# Patient Record
Sex: Male | Born: 1995
Health system: Southern US, Community
[De-identification: ages and names within clinical notes are randomized; demographics above are authoritative.]

## PROBLEM LIST (undated history)

## (undated) DIAGNOSIS — S060XAA Concussion with loss of consciousness status unknown, initial encounter: Secondary | ICD-10-CM

## (undated) DIAGNOSIS — S060X9A Concussion with loss of consciousness of unspecified duration, initial encounter: Secondary | ICD-10-CM

## (undated) DIAGNOSIS — J45909 Unspecified asthma, uncomplicated: Secondary | ICD-10-CM

## (undated) HISTORY — PX: WISDOM TOOTH EXTRACTION: SHX21

---
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
## 2015-12-01 ENCOUNTER — Encounter (HOSPITAL_BASED_OUTPATIENT_CLINIC_OR_DEPARTMENT_OTHER): Payer: Self-pay

## 2015-12-01 ENCOUNTER — Emergency Department (HOSPITAL_BASED_OUTPATIENT_CLINIC_OR_DEPARTMENT_OTHER)
Admission: EM | Admit: 2015-12-01 | Discharge: 2015-12-02 | Disposition: A | Discharge status: Home / Self Care | Payer: 59 | Attending: Emergency Medicine | Admitting: Emergency Medicine

## 2015-12-01 DIAGNOSIS — J45909 Unspecified asthma, uncomplicated: Secondary | ICD-10-CM | POA: Insufficient documentation

## 2015-12-01 DIAGNOSIS — B349 Viral infection, unspecified: Secondary | ICD-10-CM | POA: Insufficient documentation

## 2015-12-01 DIAGNOSIS — R197 Diarrhea, unspecified: Secondary | ICD-10-CM

## 2015-12-01 DIAGNOSIS — H9209 Otalgia, unspecified ear: Secondary | ICD-10-CM | POA: Insufficient documentation

## 2015-12-01 DIAGNOSIS — R509 Fever, unspecified: Secondary | ICD-10-CM | POA: Diagnosis present

## 2015-12-01 MED ORDER — ACETAMINOPHEN 325 MG PO TABS
650.0000 mg | ORAL_TABLET | Freq: Once | ORAL | Status: AC
  Administered 2015-12-01: 650 mg via ORAL
  Filled 2015-12-01: qty 2

## 2015-12-01 MED ORDER — IBUPROFEN 400 MG PO TABS
400.0000 mg | ORAL_TABLET | Freq: Once | ORAL | Status: AC
  Administered 2015-12-02: 400 mg via ORAL
  Filled 2015-12-01: qty 1

## 2015-12-01 MED ORDER — ONDANSETRON 8 MG PO TBDP
8.0000 mg | ORAL_TABLET | Freq: Once | ORAL | Status: AC
  Administered 2015-12-02: 8 mg via ORAL
  Filled 2015-12-01: qty 1

## 2015-12-01 MED ORDER — DICYCLOMINE HCL 10 MG/ML IM SOLN
20.0000 mg | Freq: Once | INTRAMUSCULAR | Status: AC
  Administered 2015-12-02: 20 mg via INTRAMUSCULAR
  Filled 2015-12-01: qty 2

## 2015-12-01 NOTE — ED Notes (Addendum)
C/o fever x 48 hours-diarrhea x 3 today-ibuprofen 200mg  at 930pm-NAD-steady gait

## 2015-12-01 NOTE — ED Provider Notes (Signed)
CSN: [id number]     Arrival date & time 12/01/15  2325 History  By signing my name below, I, Alyssa GroveMartin Green, attest that this documentation has been prepared under the direction and in the presence of Shirell Struthers, MD. Electronically Signed: Alyssa GroveMartin Green, ED Scribe. 12/01/2015. 11:48 PM.   Chief Complaint  Patient presents with  . Fever    Patient is a 20 y.o. male presenting with diarrhea. The history is provided by the patient. No language interpreter was used.  Diarrhea Quality:  Watery Severity:  Mild Onset quality:  Sudden Number of episodes:  3 Timing:  Rare Progression:  Unchanged Relieved by:  Nothing Worsened by:  Nothing tried Ineffective treatments:  None tried Associated symptoms: fever, headaches and URI   Associated symptoms: no abdominal pain and no vomiting   Risk factors: no sick contacts, no suspicious food intake and no travel to endemic areas     HPI Comments: William Mckee is a 20 y.o. male who presents to the Emergency Department complaining of fever and diarrhead onset 2 days ago. Pt reports fever of 48 hours and diarrhea of 18 hours. Pt reports associated headaches, sore throat, and nausea. Pt used ibuprofen with relief to symptoms, but symptoms returned. Pt has also used Excedrin for his headache. Pt denies foreign travel, camping, or sick contact. Pt denies vomiting and rash. Pt denies FHx of Chron's disease or Ulcerative Colitis.  Past Medical History  Diagnosis Date  . Asthma   . Concussion    Past Surgical History  Procedure Laterality Date  . Wisdom tooth extraction     No family history on file. Social History  Substance Use Topics  . Smoking status: Never Smoker   . Smokeless tobacco: None  . Alcohol Use: Yes     Comment: occ    Review of Systems  Constitutional: Positive for fever.  HENT: Positive for congestion, ear pain and sore throat. Negative for trouble swallowing.   Cardiovascular: Negative for chest pain.  Gastrointestinal:  Positive for diarrhea. Negative for vomiting and abdominal pain.  Neurological: Positive for headaches.  All other systems reviewed and are negative.   Allergies  Review of patient's allergies indicates no known allergies.  Home Medications   Prior to Admission medications   Not on File   BP 143/88 mmHg  Pulse 109  Temp(Src) 103.1 F (39.5 C) (Oral)  Resp 18  Ht 6\' 1"  (1.854 m)  Wt 210 lb (95.255 kg)  BMI 27.71 kg/m2  SpO2 96% Physical Exam  Constitutional: He is oriented to person, place, and time. He appears well-developed and well-nourished. No distress.  HENT:  Head: Normocephalic and atraumatic.  Right Ear: Tympanic membrane normal.  Left Ear: Tympanic membrane normal.  Mouth/Throat: Oropharynx is clear and moist. No oropharyngeal exudate.  Eyes: EOM are normal. Pupils are equal, round, and reactive to light.  Neck: Normal range of motion. Neck supple. No tracheal deviation present.  Cardiovascular: Normal rate, regular rhythm, normal heart sounds and intact distal pulses.   Pulmonary/Chest: Effort normal and breath sounds normal. No respiratory distress. He has no wheezes. He has no rales.  Abdominal: Soft. Bowel sounds are increased. There is no tenderness. There is no rigidity, no rebound, no guarding, no tenderness at McBurney's point and negative Murphy's sign.  Hyperactive bowel sounds  Musculoskeletal: Normal range of motion.  Lymphadenopathy:    He has no cervical adenopathy.  Neurological: He is alert and oriented to person, place, and time. He has normal reflexes. He  displays normal reflexes.  Skin: Skin is warm and dry.  Psychiatric: He has a normal mood and affect.  Nursing note and vitals reviewed.   ED Course  Procedures (including critical care time)  DIAGNOSTIC STUDIES: Oxygen Saturation is 96% on RA, adequate by my interpretation.    COORDINATION OF CARE: 11:55 PM Discussed treatment plan with pt at bedside which includes Rapid Strep Test and  pt agreed to plan.  Labs Review Labs Reviewed - No data to display  Imaging Review No results found. I have personally reviewed and evaluated these images and lab results as part of my medical decision-making.   EKG Interpretation None      MDM   Final diagnoses:  None    Filed Vitals:   12/01/15 2330 12/02/15 0134  BP: 143/88 119/58  Pulse: 109 74  Temp: 103.1 F (39.5 C) 98.6 F (37 C)  Resp: 18 16   Results for orders placed or performed during the hospital encounter of 12/01/15  Rapid strep screen  Result Value Ref Range   Streptococcus, Group A Screen (Direct) NEGATIVE NEGATIVE   No results found.  Medications  acetaminophen (TYLENOL) tablet 650 mg (650 mg Oral Given 12/01/15 2336)  ondansetron (ZOFRAN-ODT) disintegrating tablet 8 mg (8 mg Oral Given 12/02/15 0006)  dicyclomine (BENTYL) injection 20 mg (20 mg Intramuscular Given 12/02/15 0007)  ibuprofen (ADVIL,MOTRIN) tablet 400 mg (400 mg Oral Given 12/02/15 0006)    Given constellation of symptoms,  This is a viral illness.  No diarrhea in the ED PO challenged.  Bland diet x 48 hours.  Follow up with your PMD.  Strict return precautions given  I personally performed the services described in this documentation, which was scribed in my presence. The recorded information has been reviewed and is accurate.      Cy BlamerApril Panayiota Larkin, MD 12/02/15 [phone]

## 2015-12-01 NOTE — ED Notes (Signed)
MD at bedside. 

## 2015-12-02 ENCOUNTER — Encounter (HOSPITAL_BASED_OUTPATIENT_CLINIC_OR_DEPARTMENT_OTHER): Payer: Self-pay | Admitting: Emergency Medicine

## 2015-12-02 LAB — RAPID STREP SCREEN (MED CTR MEBANE ONLY): STREPTOCOCCUS, GROUP A SCREEN (DIRECT): NEGATIVE

## 2015-12-02 MED ORDER — DICYCLOMINE HCL 20 MG PO TABS: 20.0000 mg | ORAL_TABLET | Freq: Two times a day (BID) | ORAL | Status: AC

## 2015-12-02 MED ORDER — IBUPROFEN 800 MG PO TABS: 800.0000 mg | ORAL_TABLET | Freq: Three times a day (TID) | ORAL | Status: AC

## 2015-12-02 MED FILL — DICYCLOMINE 20 MG TABLET: 20 | 10 days supply | Qty: 20 | Fill #0

## 2015-12-02 MED FILL — IBUPROFEN 800 MG TABLET: 800 | 7 days supply | Qty: 21 | Fill #0

## 2015-12-04 LAB — CULTURE, GROUP A STREP (THRC)

## 2019-04-26 ENCOUNTER — Other Ambulatory Visit: Payer: Self-pay

## 2019-04-26 DIAGNOSIS — Z20822 Contact with and (suspected) exposure to covid-19: Secondary | ICD-10-CM

## 2019-04-27 LAB — NOVEL CORONAVIRUS, NAA: SARS-CoV-2, NAA: NOT DETECTED
